# Patient Record
Sex: Female | Born: 2000 | Race: White | Hispanic: No | Marital: Single | Smoking: Never smoker
Health system: Southern US, Community
[De-identification: ages and names within clinical notes are randomized; demographics above are authoritative.]

## PROBLEM LIST (undated history)

## (undated) DIAGNOSIS — E063 Autoimmune thyroiditis: Secondary | ICD-10-CM

## (undated) DIAGNOSIS — F419 Anxiety disorder, unspecified: Secondary | ICD-10-CM

---
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
## 2024-05-23 ENCOUNTER — Emergency Department (HOSPITAL_COMMUNITY)
Admission: EM | Admit: 2024-05-23 | Discharge: 2024-05-23 | Disposition: A | Discharge status: Home / Self Care | Attending: Emergency Medicine | Admitting: Emergency Medicine

## 2024-05-23 ENCOUNTER — Emergency Department (HOSPITAL_COMMUNITY)

## 2024-05-23 ENCOUNTER — Encounter (HOSPITAL_COMMUNITY): Payer: Self-pay

## 2024-05-23 ENCOUNTER — Other Ambulatory Visit: Payer: Self-pay

## 2024-05-23 DIAGNOSIS — R202 Paresthesia of skin: Secondary | ICD-10-CM | POA: Insufficient documentation

## 2024-05-23 DIAGNOSIS — M545 Low back pain, unspecified: Secondary | ICD-10-CM | POA: Insufficient documentation

## 2024-05-23 HISTORY — DX: Autoimmune thyroiditis: E06.3

## 2024-05-23 HISTORY — DX: Anxiety disorder, unspecified: F41.9

## 2024-05-23 LAB — I-STAT CHEM 8, ED
BUN: 19 mg/dL (ref 6–20)
Calcium, Ion: 1.23 mmol/L (ref 1.15–1.40)
Chloride: 99 mmol/L (ref 98–111)
Creatinine, Ser: 0.9 mg/dL (ref 0.44–1.00)
Glucose, Bld: 94 mg/dL (ref 70–99)
HCT: 40 % (ref 36.0–46.0)
Hemoglobin: 13.6 g/dL (ref 12.0–15.0)
Potassium: 3.8 mmol/L (ref 3.5–5.1)
Sodium: 138 mmol/L (ref 135–145)
TCO2: 24 mmol/L (ref 22–32)

## 2024-05-23 LAB — CBC WITH DIFFERENTIAL/PLATELET
Abs Immature Granulocytes: 0.01 10*3/uL (ref 0.00–0.07)
Basophils Absolute: 0.1 10*3/uL (ref 0.0–0.1)
Basophils Relative: 1 %
Eosinophils Absolute: 0.4 10*3/uL (ref 0.0–0.5)
Eosinophils Relative: 5 %
HCT: 40 % (ref 36.0–46.0)
Hemoglobin: 13.6 g/dL (ref 12.0–15.0)
Immature Granulocytes: 0 %
Lymphocytes Relative: 35 %
Lymphs Abs: 2.6 10*3/uL (ref 0.7–4.0)
MCH: 31.3 pg (ref 26.0–34.0)
MCHC: 34 g/dL (ref 30.0–36.0)
MCV: 92.2 fL (ref 80.0–100.0)
Monocytes Absolute: 0.6 10*3/uL (ref 0.1–1.0)
Monocytes Relative: 9 %
Neutro Abs: 3.6 10*3/uL (ref 1.7–7.7)
Neutrophils Relative %: 50 %
Platelets: 256 10*3/uL (ref 150–400)
RBC: 4.34 MIL/uL (ref 3.87–5.11)
RDW: 11.9 % (ref 11.5–15.5)
WBC: 7.3 10*3/uL (ref 4.0–10.5)
nRBC: 0 % (ref 0.0–0.2)

## 2024-05-23 LAB — URINALYSIS, ROUTINE W REFLEX MICROSCOPIC
Bilirubin Urine: NEGATIVE
Glucose, UA: NEGATIVE mg/dL
Hgb urine dipstick: NEGATIVE
Ketones, ur: NEGATIVE mg/dL
Nitrite: NEGATIVE
Protein, ur: NEGATIVE mg/dL
Specific Gravity, Urine: 1.009 (ref 1.005–1.030)
pH: 6 (ref 5.0–8.0)

## 2024-05-23 LAB — PREGNANCY, URINE: Preg Test, Ur: NEGATIVE

## 2024-05-23 MED ORDER — NAPROXEN 500 MG PO TABS: 500.0000 mg | ORAL_TABLET | Freq: Two times a day (BID) | ORAL | 0 refills | Status: AC

## 2024-05-23 MED ORDER — LIDOCAINE 5 % EX PTCH
1.0000 | MEDICATED_PATCH | CUTANEOUS | Status: DC
  Administered 2024-05-23: 1 via TRANSDERMAL
  Filled 2024-05-23: qty 1

## 2024-05-23 MED ORDER — LIDOCAINE 5 % EX PTCH: 1.0000 | MEDICATED_PATCH | CUTANEOUS | 0 refills | Status: AC

## 2024-05-23 MED ORDER — FOSFOMYCIN TROMETHAMINE 3 G PO PACK
3.0000 g | PACK | Freq: Once | ORAL | Status: AC
  Administered 2024-05-23: 3 g via ORAL
  Filled 2024-05-23: qty 3

## 2024-05-23 MED ORDER — ACETAMINOPHEN 500 MG PO TABS
1000.0000 mg | ORAL_TABLET | Freq: Once | ORAL | Status: AC
  Administered 2024-05-23: 1000 mg via ORAL
  Filled 2024-05-23: qty 2

## 2024-05-23 NOTE — ED Provider Notes (Signed)
 " [place] EMERGENCY DEPARTMENT AT [facility] Provider Note   CSN: [id number] Arrival date & time: 05/23/24  9882     Patient presents with: Anxiety   Madeline Richard is a 24 y.o. female.   The history is provided by the patient.  Illness Location:  Low back and all limbs Quality:  Back pain since August and intermittent limb paresthesias Onset quality:  Gradual Duration:  6 months Progression:  Waxing and waning Context:  Has seen PMD and had work up forrheumatologic illness did not get back Xray Relieved by:  Nothing Worsened by:  Nothing Ineffective treatments:  None Associated symptoms: no chest pain, no fever and no wheezing   Associated symptoms comment:  Does have anxiety but wants all causes ruled out       Prior to Admission medications  Medication Sig Start Date End Date Taking? Authorizing Provider  lidocaine  (LIDODERM ) 5 % Place 1 patch onto the skin daily. Remove & Discard patch within 12 hours or as directed by MD 05/23/24  Yes Charlee Squibb, MD  cephALEXin  (KEFLEX ) 500 MG capsule Take 1 capsule (500 mg total) by mouth 2 (two) times daily. 03/07/15   Gershon Donnice SAUNDERS, DPM  naproxen  (NAPROSYN ) 375 MG tablet  02/28/15   [provider]    Allergies: Codeine    Review of Systems  Constitutional:  Negative for fever.  Respiratory:  Negative for wheezing and stridor.   Cardiovascular:  Negative for chest pain.  Neurological:  Negative for syncope, facial asymmetry and speech difficulty.  All other systems reviewed and are negative.   Updated Vital Signs BP (!) 136/95 (BP Location: Right Arm)   Pulse 94   Temp (!) 97.4 F (36.3 C)   Resp (!) 22   LMP  (LMP Unknown)   SpO2 100%   Physical Exam Vitals and nursing note reviewed.  Constitutional:      General: She is not in acute distress.    Appearance: Normal appearance. She is well-developed.  HENT:     Head: Normocephalic and atraumatic.     Nose: Nose normal.  Eyes:      Pupils: Pupils are equal, round, and reactive to light.  Cardiovascular:     Rate and Rhythm: Normal rate and regular rhythm.     Pulses: Normal pulses.     Heart sounds: Normal heart sounds.  Pulmonary:     Effort: Pulmonary effort is normal. No respiratory distress.     Breath sounds: Normal breath sounds.  Abdominal:     General: Bowel sounds are normal. There is no distension.     Palpations: Abdomen is soft.     Tenderness: There is no abdominal tenderness. There is no guarding or rebound.  Musculoskeletal:        General: Normal range of motion.     Cervical back: Normal range of motion and neck supple.  Skin:    General: Skin is dry.     Capillary Refill: Capillary refill takes less than 2 seconds.     Findings: No erythema or rash.  Neurological:     General: No focal deficit present.     Mental Status: She is alert and oriented to person, place, and time.     Sensory: No sensory deficit.     Motor: No weakness.     Deep Tendon Reflexes: Reflexes normal.  Psychiatric:        Mood and Affect: Mood normal.     (all labs ordered are  listed, but only abnormal results are displayed) Results for orders placed or performed during the hospital encounter of 05/23/24  Urinalysis, Routine w reflex microscopic -Urine, Clean Catch   Collection Time: 05/23/24  3:13 AM  Result Value Ref Range   Color, Urine STRAW (A) YELLOW   APPearance CLEAR CLEAR   Specific Gravity, Urine 1.009 1.005 - 1.030   pH 6.0 5.0 - 8.0   Glucose, UA NEGATIVE NEGATIVE mg/dL   Hgb urine dipstick NEGATIVE NEGATIVE   Bilirubin Urine NEGATIVE NEGATIVE   Ketones, ur NEGATIVE NEGATIVE mg/dL   Protein, ur NEGATIVE NEGATIVE mg/dL   Nitrite NEGATIVE NEGATIVE   Leukocytes,Ua TRACE (A) NEGATIVE   RBC / HPF 0-5 0 - 5 RBC/hpf   WBC, UA 0-5 0 - 5 WBC/hpf   Bacteria, UA RARE (A) NONE SEEN   Squamous Epithelial / HPF 0-5 0 - 5 /HPF   Mucus PRESENT   Pregnancy, urine   Collection Time: 05/23/24  3:14 AM  Result  Value Ref Range   Preg Test, Ur NEGATIVE NEGATIVE  CBC with Differential   Collection Time: 05/23/24  3:41 AM  Result Value Ref Range   WBC 7.3 4.0 - 10.5 K/uL   RBC 4.34 3.87 - 5.11 MIL/uL   Hemoglobin 13.6 12.0 - 15.0 g/dL   HCT 59.9 63.9 - 53.9 %   MCV 92.2 80.0 - 100.0 fL   MCH 31.3 26.0 - 34.0 pg   MCHC 34.0 30.0 - 36.0 g/dL   RDW 88.0 88.4 - 84.4 %   Platelets 256 150 - 400 K/uL   nRBC 0.0 0.0 - 0.2 %   Neutrophils Relative % 50 %   Neutro Abs 3.6 1.7 - 7.7 K/uL   Lymphocytes Relative 35 %   Lymphs Abs 2.6 0.7 - 4.0 K/uL   Monocytes Relative 9 %   Monocytes Absolute 0.6 0.1 - 1.0 K/uL   Eosinophils Relative 5 %   Eosinophils Absolute 0.4 0.0 - 0.5 K/uL   Basophils Relative 1 %   Basophils Absolute 0.1 0.0 - 0.1 K/uL   Immature Granulocytes 0 %   Abs Immature Granulocytes 0.01 0.00 - 0.07 K/uL  I-stat chem 8, ED (not at [facility], [facility] or [facility])   Collection Time: 05/23/24  3:48 AM  Result Value Ref Range   Sodium 138 135 - 145 mmol/L   Potassium 3.8 3.5 - 5.1 mmol/L   Chloride 99 98 - 111 mmol/L   BUN 19 6 - 20 mg/dL   Creatinine, Ser 9.09 0.44 - 1.00 mg/dL   Glucose, Bld 94 70 - 99 mg/dL   Calcium, Ion 8.76 8.84 - 1.40 mmol/L   TCO2 24 22 - 32 mmol/L   Hemoglobin 13.6 12.0 - 15.0 g/dL   HCT 59.9 63.9 - 53.9 %   DG Lumbar Spine Complete Result Date: 05/23/2024 EXAM: 4 VIEW(S) XRAY OF THE LUMBAR SPINE 05/23/2024 02:47:28 AM COMPARISON: None available. CLINICAL HISTORY: painetc Pain. FINDINGS: LUMBAR SPINE: BONES: Vertebral body heights are maintained. Alignment is normal. DISCS AND DEGENERATIVE CHANGES: No severe degenerative changes. SOFT TISSUES: Intrauterine device noted within the posterior mid pelvis. IMPRESSION: 1. No significant abnormality of the lumbar spine. Electronically signed by: Dorethia Molt MD 05/23/2024 03:42 AM EST RP Workstation: [id number]     Radiology: DG Lumbar Spine Complete Result Date: 05/23/2024 EXAM: 4 VIEW(S) XRAY OF THE LUMBAR SPINE  05/23/2024 02:47:28 AM COMPARISON: None available. CLINICAL HISTORY: painetc Pain. FINDINGS: LUMBAR SPINE: BONES: Vertebral body heights are maintained. Alignment is normal. DISCS  AND DEGENERATIVE CHANGES: No severe degenerative changes. SOFT TISSUES: Intrauterine device noted within the posterior mid pelvis. IMPRESSION: 1. No significant abnormality of the lumbar spine. Electronically signed by: Dorethia Molt MD 05/23/2024 03:42 AM EST RP Workstation: [id number]     Procedures   Medications Ordered in the ED  lidocaine  (LIDODERM ) 5 % 1 patch (1 patch Transdermal Patch Applied 05/23/24 0331)  acetaminophen  (TYLENOL ) tablet 1,000 mg (1,000 mg Oral Given 05/23/24 0331)  fosfomycin  (MONUROL ) packet 3 g (3 g Oral Given 05/23/24 0416)                                    Medical Decision Making Patient presents with low back pain and intermittent limb paresthesias   Amount and/or Complexity of Data Reviewed External Data Reviewed: labs and notes.    Details: Previous outpatient work up by pMDF reviewed  Labs: ordered.    Details: Urine with trace leukocyte esterase, pregnancy negative. Normal white count 7.3, normal hemoglobin 13.6  Radiology: ordered and independent interpretation performed.    Details: No fractures by me on LS spine   Risk OTC drugs. Prescription drug management. Risk Details: Patient is well appearing.  Patient is neurovascularly intact.  No weakness no numbness.  Intact DTRs.  Will have patient follow up with neurology and PMD as an outpatient.  Stable for discharge.  Strict returns        Final diagnoses:  Acute low back pain without sciatica, unspecified back pain laterality  Paresthesias  No signs of systemic illness or infection. The patient is nontoxic-appearing on exam and vital signs are within normal limits.  I have reviewed the triage vital signs and the nursing notes. Pertinent labs & imaging results that were available during my care of the patient were  reviewed by me and considered in my medical decision making (see chart for details). After history, exam, and medical workup I feel the patient has been appropriately medically screened and is safe for discharge home. Pertinent diagnoses were discussed with the patient. Patient was given return precautions.     ED Discharge Orders          Ordered    lidocaine  (LIDODERM ) 5 %  Every 24 hours        05/23/24 0442               Marleny Faller, MD 05/23/24 0454  "

## 2024-05-23 NOTE — ED Triage Notes (Signed)
 Pt reports that she has numbness and tingling in all extremities but started with her left arm x 4 hours ago while lying down. Pt does admit that she has anxiety and this may be contributing factor, Pt also reports low back pain x 8 months and wonders if leg numbness and tingling is related

## 2024-05-23 NOTE — ED Notes (Signed)
 Patient has been given water and able to keep down with no issues. Patient states she feels much better.
